# Patient Record
Sex: Male | Born: 1950 | Race: White
Health system: Southern US, Community
[De-identification: ages and names within clinical notes are randomized; demographics above are authoritative.]

---

## 2016-02-18 DIAGNOSIS — R972 Elevated prostate specific antigen [PSA]: Secondary | ICD-10-CM | POA: Insufficient documentation

## 2016-02-19 DIAGNOSIS — E291 Testicular hypofunction: Secondary | ICD-10-CM | POA: Insufficient documentation

## 2016-02-19 DIAGNOSIS — N529 Male erectile dysfunction, unspecified: Secondary | ICD-10-CM | POA: Insufficient documentation

## 2016-02-19 DIAGNOSIS — N400 Enlarged prostate without lower urinary tract symptoms: Secondary | ICD-10-CM | POA: Insufficient documentation

## 2017-07-24 ENCOUNTER — Ambulatory Visit (INDEPENDENT_AMBULATORY_CARE_PROVIDER_SITE_OTHER): Payer: BLUE CROSS/BLUE SHIELD

## 2017-07-24 ENCOUNTER — Ambulatory Visit (INDEPENDENT_AMBULATORY_CARE_PROVIDER_SITE_OTHER): Payer: BLUE CROSS/BLUE SHIELD | Admitting: Podiatry

## 2017-07-24 ENCOUNTER — Encounter: Payer: Self-pay | Admitting: Podiatry

## 2017-07-24 DIAGNOSIS — M21619 Bunion of unspecified foot: Secondary | ICD-10-CM | POA: Diagnosis not present

## 2017-07-24 DIAGNOSIS — M19071 Primary osteoarthritis, right ankle and foot: Secondary | ICD-10-CM

## 2017-07-24 DIAGNOSIS — M779 Enthesopathy, unspecified: Secondary | ICD-10-CM | POA: Diagnosis not present

## 2017-07-24 MED ORDER — MELOXICAM 15 MG PO TABS: 15.0000 mg | ORAL_TABLET | Freq: Every day | ORAL | 2 refills | Status: DC

## 2017-07-24 NOTE — Progress Notes (Signed)
   Subjective:    Patient ID: Wesley Campbell, male    DOB: Jun 09, 1951, 66 y.o.   MRN: [id number]  HPI  65 year old [place] the op city for concerns of right foot pain. He states that hurts across the top of his foot which is been ongoing for about 8 weeks and also going into the big toe. He states that he did have a rock hit his ankle before this happened his the ankle not pain subsided and the pain to the top of the foot started has continued since. He is also noticed swelling to the right foot which is new for him but denies any redness or warmth. He states he is a very flatfoot and is his been ongoing his entire life. He previously did have custom orthotics but not been wearing them recently. He denies any specific injury to his foot other than the rock incident that he can recall. He has no other concerns.   Review of Systems  All other systems reviewed and are negative.      Objective:   Physical Exam General: AAO x3, NAD  Dermatological: Skin is warm, dry and supple bilateral. Nails x 10 are well manicured; remaining integument appears unremarkable at this time. There are no open sores, no preulcerative lesions, no rash or signs of infection present.  Vascular: Dorsalis Pedis artery and Posterior Tibial artery pedal pulses are 2/4 bilateral with immedate capillary fill time. Pedal hair growth present. There is no pain with calf compression, swelling, warmth, erythema.   Neruologic: Grossly intact via light touch bilateral. Protective threshold with Semmes Wienstein monofilament intact to all pedal sites bilateral.  Musculoskeletal: There is moderate edema to the dorsal aspect of the right foot compared to his contralateral extremity with no erythema or increase in warmth associated with it. There is diffuse tenderness mostly along the Lisfranc joint more medial. Is also tenderness going into the hallux. There does be some mild discomfort vibratory sensation along the third metatarsal  base. There is no other area of pain to vibratory sensation. There is decreased range of motion of the subtalar joint. There is decrease in medial arch height. No pain with ankle joint range of motion there is no pain with ankle ligaments or bone. There is no other area of tenderness within 5 bilaterally. Muscular strength 5/5 in all groups tested bilateral.  Gait: Unassisted, Nonantalgic.      Assessment & Plan:  66 year old male right foot pain, swelling concern for possible stress fracture given swelling/pain. I think gout is unlikely given the longevity of symptoms.  -Treatment options discussed including all alternatives, risks, and complications -Etiology of symptoms were discussed -X-rays were obtained and reviewed with the patient. Arthritic changes are present. There is no evidence of acute fracture identified. -At this time given the swelling of actually bleeding we need to help rest this to get the swelling and the pain down. I believe that immobilization in a cam boot would be beneficial for him. This is dispensed him today. Also dispensed a compression anklet. Meloxicam prescribed for anti-inflammatory effect. Ice to the area as well. -Long-term he is going to need a new custom orthotic. I would have him come in to see Raiford Noble at his next appointment with me in which check with insurance authorization by [facility] scanned him today for the insert measurements due to the swelling to his foot. Hopefully we can do this next appointment. He agrees to this plan.   Ovid Curd, DPM

## 2017-08-03 ENCOUNTER — Telehealth: Payer: Self-pay | Admitting: Podiatry

## 2017-08-03 NOTE — Telephone Encounter (Signed)
Pt states he has a new pain and lots of swelling and is concerned about the new pain. Pt states he has been up on the foot moderately. I told pt Dr. Ardelle Anton specifically said to rest the area. I told pt I felt he would benefit from possibly an appt tomorrow or Monday. I told pt to rest as much as possible, ice the area 3-4 times daily for 15-20 minutes each session which would help with the pain and swelling, make sure to protect skin from the ice pack. Pt states understanding and I transferred to schedulers.

## 2017-08-03 NOTE — Telephone Encounter (Signed)
I'm calling because I'm continuing to have pain and it seems as though it has gotten worse in some ways. If you could please give this message to either Dr. Ardelle Anton or his nurse Misty Stanley as I would like to speak to either of them, preferably Dr. Ardelle Anton. My number is [phone].

## 2017-08-04 ENCOUNTER — Encounter: Payer: Self-pay | Admitting: Podiatry

## 2017-08-04 ENCOUNTER — Ambulatory Visit (INDEPENDENT_AMBULATORY_CARE_PROVIDER_SITE_OTHER): Payer: BLUE CROSS/BLUE SHIELD

## 2017-08-04 ENCOUNTER — Ambulatory Visit (INDEPENDENT_AMBULATORY_CARE_PROVIDER_SITE_OTHER): Payer: BLUE CROSS/BLUE SHIELD | Admitting: Podiatry

## 2017-08-04 DIAGNOSIS — M19071 Primary osteoarthritis, right ankle and foot: Secondary | ICD-10-CM | POA: Diagnosis not present

## 2017-08-04 DIAGNOSIS — M659 Synovitis and tenosynovitis, unspecified: Secondary | ICD-10-CM | POA: Diagnosis not present

## 2017-08-04 DIAGNOSIS — M779 Enthesopathy, unspecified: Secondary | ICD-10-CM

## 2017-08-04 NOTE — Progress Notes (Signed)
Subjective: Mr. Manseau presents the office today for follow-up of a way she continued pain and swelling to the right foot. He states the meloxicam is not helping he has not noticed significant improvement he is concerned of the foot is still swelling as well as the ankle. He is concerned that there may be a chip of bone on the ankle as he had a rock hit the ankle very hard before the swelling and the pain started. He does wear the cam bit which helped some. He has no other concerns today. Denies any systemic complaints such as fevers, chills, nausea, vomiting. No acute changes since last appointment, and no other complaints at this time.   Objective: AAO x3, NAD DP/PT pulses palpable bilaterally, CRT less than 3 seconds There is significant decrease in medial arch upon weightbearing. There is some mild discomfort along the midfoot along the right foot. The majority tenderness today. The medial aspect of the ankle and there is more tenderness in the course of posterior tibial tendons, flexor tendons just posterior inferior to the medial malleolus along the insertion of the navicular tuberosity. There is mild discomfort along the distal medial malleolus. There is decreased range of motion since surgery with ankle joint range of motion intact. It gets a recent mild improvement in the swelling compared to prior appointment there is no erythema or increase in warmth. No open lesions or pre-ulcerative lesions.  No pain with calf compression, swelling, warmth, erythema  Assessment: Posterior tibial tendon dysfunction, also arthritis right foot and ankle  Plan: -All treatment options discussed with the patient including all alternatives, risks, complications.  -X-rays were obtained and reviewed with the patient today. No definitive evidence of acute fracture identified how there is arthritic changes of the rear foot. -At this point recommended MRI of the right foot and ankle to evaluate dysfunction of the  posterior tibial tendon rule out tear of the tendon as well as evaluate also arthritis of the foot.-Continue with meloxicam also continue ice and immobilization in a cam boot. He declined a Medrol Dosepak. -Follow-up after MRI or sooner if any issues are to arise. -Patient encouraged to call the office with any questions, concerns, change in symptoms.   Ovid Curd, DPM

## 2017-08-07 ENCOUNTER — Telehealth: Payer: Self-pay | Admitting: *Deleted

## 2017-08-07 DIAGNOSIS — M19079 Primary osteoarthritis, unspecified ankle and foot: Secondary | ICD-10-CM

## 2017-08-07 DIAGNOSIS — M779 Enthesopathy, unspecified: Secondary | ICD-10-CM

## 2017-08-07 NOTE — Telephone Encounter (Addendum)
-----   Message from Vivi Barrack, DPM sent at 08/04/2017  2:50 PM EDT ----- Can you please order an MRI of the right foot and ankle to rule out posterior tibial tear and look at midfoot arthritis? He would like an open MRI. Thanks. 08/07/2017-Orders to [place] [place] for [employer], and faxed to [facility].

## 2017-08-14 ENCOUNTER — Telehealth: Payer: Self-pay | Admitting: *Deleted

## 2017-08-14 ENCOUNTER — Ambulatory Visit: Payer: BLUE CROSS/BLUE SHIELD | Admitting: Podiatry

## 2017-08-14 ENCOUNTER — Other Ambulatory Visit: Payer: BLUE CROSS/BLUE SHIELD | Admitting: Orthotics

## 2017-08-14 NOTE — Telephone Encounter (Signed)
[employer] requires PEER TO PEER for prior authorization for MRI right foot 73718, and right ankle 73721.

## 2017-08-17 NOTE — Telephone Encounter (Signed)
Authorization # [id number] until [facility]09/[facility].

## 2017-08-17 NOTE — Telephone Encounter (Signed)
Faxed orders with Authorization to [facility]. 

## 2017-08-24 ENCOUNTER — Ambulatory Visit
Admission: RE | Admit: 2017-08-24 | Discharge: 2017-08-24 | Disposition: A | Discharge status: Home / Self Care | Payer: BLUE CROSS/BLUE SHIELD | Source: Ambulatory Visit | Attending: Podiatry | Admitting: Podiatry

## 2017-08-25 ENCOUNTER — Telehealth: Payer: Self-pay | Admitting: *Deleted

## 2017-08-25 NOTE — Telephone Encounter (Addendum)
-----   Message from Vivi BarrackMatthew R Wagoner, DPM sent at 08/25/2017  2:10 PM EDT ----- + tear of posterior tibial tendon and possible stress fracture remain in CAM boot and schedule follow-up to discuss. Thanks. I informed pt of Dr. Gabriel RungWagoner's review of results and orders, transferred pt to schedulers for appt.

## 2017-09-04 ENCOUNTER — Ambulatory Visit (INDEPENDENT_AMBULATORY_CARE_PROVIDER_SITE_OTHER): Payer: BLUE CROSS/BLUE SHIELD | Admitting: Podiatry

## 2017-09-04 ENCOUNTER — Encounter: Payer: Self-pay | Admitting: Podiatry

## 2017-09-04 DIAGNOSIS — M76829 Posterior tibial tendinitis, unspecified leg: Secondary | ICD-10-CM

## 2017-09-04 DIAGNOSIS — M19071 Primary osteoarthritis, right ankle and foot: Secondary | ICD-10-CM

## 2017-09-04 DIAGNOSIS — S96911D Strain of unspecified muscle and tendon at ankle and foot level, right foot, subsequent encounter: Secondary | ICD-10-CM

## 2017-09-05 ENCOUNTER — Ambulatory Visit: Payer: BLUE CROSS/BLUE SHIELD | Admitting: Podiatry

## 2017-09-06 NOTE — Progress Notes (Signed)
Subjective: [name] presents the office today for follow-up evaluation of right foot and ankle pain. States that overall his pain is improving. He still gets some discomfort at times. He presents today wearing a regular shoe. He does wear the cam boot intermittently. He denies any recent injury or trauma since last appointment he denies any significant swelling the swelling overall has improved. Denies any redness or warmth. He has no other concerns today. Denies any systemic complaints such as fevers, chills, nausea, vomiting. No acute changes since last appointment, and no other complaints at this time.   Objective: AAO x3, NAD DP/PT pulses palpable bilaterally, CRT less than 3 seconds There is significant decrease in medial arch upon weightbearing. [place] tenderness today. Be localized just posterior to the medial malleolus along the posterior tibial tendon, flexor tendons. There is no significant discomfort on the first metatarsal base or cuneiform. There is no area pinpoint bony tenderness or pain the vibratory sensation. There is no slipping edema, erythema, increase in warmth. There is a significant decrease in medial arch upon weightbearing. Equinus is present. Subtalar joint range of motion decrease. Ankle joint range of motion intact. No open lesions or pre-ulcerative lesions.  No pain with calf compression, swelling, warmth, erythema  MRI 08/24/2017: IMPRESSION: 1. High-grade, partial tear of the posterior tibialis tendon with severe pes planus and evidence of lateral hindfoot impingement. 2. Extensive marrow edema in the medial cuneiform and first metatarsal with questionable nondisplaced fracture of the medial cuneiform. First metatarsal edema may be stress related. 3. Focal marrow edema in the medial aspect of the distal tibial metaphysis, possibly contusion or stress related Assessment: Posterior tibial tendon dysfunction/tear; also arthritis with flatfoot  Plan: -All  treatment options discussed with the patient including all alternatives, risks, complications.  -MRI results were discussed with the patient. See above. We had a long discussion both conservative as well as surgical treatment options. At this point he wishes to hold off any surgical intervention. We'll try for a custom molded orthotic. If this does not help discussed possible brace but he would keep the orthotic and he feels this will be helpful as he has had them before. He was molded for orthotics today and [facility]. -RTC 3 weeks to PUO or sooner if needed.  -Patient encouraged to call the office with any questions, concerns, change in symptoms.   Ovid CurdMatthew Wagoner, DPM

## 2017-09-27 ENCOUNTER — Encounter: Payer: BLUE CROSS/BLUE SHIELD | Admitting: Orthotics

## 2017-10-02 ENCOUNTER — Ambulatory Visit (INDEPENDENT_AMBULATORY_CARE_PROVIDER_SITE_OTHER): Payer: BLUE CROSS/BLUE SHIELD | Admitting: Podiatry

## 2017-10-02 DIAGNOSIS — M19071 Primary osteoarthritis, right ankle and foot: Secondary | ICD-10-CM

## 2017-10-02 DIAGNOSIS — M76829 Posterior tibial tendinitis, unspecified leg: Secondary | ICD-10-CM

## 2017-10-03 ENCOUNTER — Telehealth: Payer: Self-pay | Admitting: Podiatry

## 2017-10-03 NOTE — Telephone Encounter (Signed)
This message is for Dr. Gabriel RungWagoner's nurse. I wanted to give a piece of information that would help Dr. Ardelle AntonWagoner get a letter out to us. If you would please call me back at [phone]. Thank you.

## 2017-10-04 ENCOUNTER — Telehealth: Payer: Self-pay | Admitting: Podiatry

## 2017-10-04 NOTE — Telephone Encounter (Signed)
I'm calling to speak to Dr. Gabriel RungWagoner's nurse Misty StanleyLisa. If you would please give me a call back at [phone]. Thank you.

## 2017-10-05 ENCOUNTER — Telehealth: Payer: Self-pay | Admitting: *Deleted

## 2017-10-05 NOTE — Telephone Encounter (Signed)
Called and left a message for the patient to call me and that he could reach me at [phone]. Misty StanleyLisa

## 2017-10-05 NOTE — Progress Notes (Signed)
Patient was seen by Raiford Nobleick today to pick up orthotics.  Orthotics were dispensed and he states they are fitting well.  We discussed an oral and written break in instructions today.  He has no other concerns with his feet today. Follow-up in 4 weeks or sooner if needed.   He went to see me because he had questions about his daughter.  After discussion I will contact his daughter in regards to these issues as he wanted some clarification about her medical records and medical history.  I will contact Lurena JoinerRebecca myself.  Vivi BarrackMatthew R Dandria Griego DPM

## 2017-10-16 ENCOUNTER — Other Ambulatory Visit: Payer: Self-pay | Admitting: Podiatry

## 2017-10-17 NOTE — Telephone Encounter (Signed)
Called and left a message for the patient on 10/05/17. Wesley Campbell

## 2017-11-15 ENCOUNTER — Telehealth: Payer: Self-pay | Admitting: *Deleted

## 2017-11-15 NOTE — Telephone Encounter (Signed)
Refill request for Meloxicam. Dr. Ardelle AntonWagoner states pt needs an appt. Return fax denying.

## 2019-02-19 ENCOUNTER — Encounter: Payer: Self-pay | Admitting: Podiatry

## 2019-02-19 ENCOUNTER — Other Ambulatory Visit: Payer: Self-pay

## 2019-02-19 ENCOUNTER — Ambulatory Visit (INDEPENDENT_AMBULATORY_CARE_PROVIDER_SITE_OTHER): Payer: Medicare Other | Admitting: Podiatry

## 2019-02-19 VITALS — Temp 97.9°F

## 2019-02-19 DIAGNOSIS — L603 Nail dystrophy: Secondary | ICD-10-CM

## 2019-02-19 DIAGNOSIS — M79674 Pain in right toe(s): Secondary | ICD-10-CM | POA: Diagnosis not present

## 2019-02-20 NOTE — Progress Notes (Signed)
Subjective: 68 year old male presents the office today for concerns of his right fourth toenail becoming very elongated.  He states is not getting ingrown but he cannot trim it.  He was having nail trimmed.  Denies any redness or drainage or any swelling.  Ankles been doing great he has no other concerns. Denies any systemic complaints such as fevers, chills, nausea, vomiting. No acute changes since last appointment, and no other complaints at this time.   Objective: AAO x3, NAD DP/PT pulses palpable bilaterally, CRT less than 3 seconds The right fourth toenail significantly elongated and is curving distally into the plantar aspect of the toe.  Upon debridement there is no underlying ulceration drainage or any signs of infection.  There is no pain after debridement. No open lesions or pre-ulcerative lesions.  No pain with calf compression, swelling, warmth, erythema  Assessment: Onychodystrophy right  fourth toe  Plan: -All treatment options discussed with the patient including all alternatives, risks, complications.  -I debrided the right fourth toenail with any complications or bleeding.  Monitoring signs or symptoms of infection any other issues. -Patient encouraged to call the office with any questions, concerns, change in symptoms.   Vivi Barrack DPM

## 2019-10-12 IMAGING — MR MR ANKLE*R* W/O CM
4 of 5 series · 15 of 40 positions shown · non-contrast
Comparison: Right foot and ankle x-rays dated August 04, 2017.

CLINICAL DATA: Right foot and ankle pain for the past 4 months.

EXAM:
MRI OF THE RIGHT FOREFOOT WITHOUT CONTRAST; MRI OF THE RIGHT ANKLE
WITHOUT CONTRAST
TECHNIQUE: Multiplanar, multisequence MR imaging of the right foot and ankle
was performed. No intravenous contrast was administered.

[Series 4: PD fat-sat · axial · 4.0mm · 0.25mm/px · z∈[-5,+133]mm · 6 of 36 slices shown]
[im 1/36]
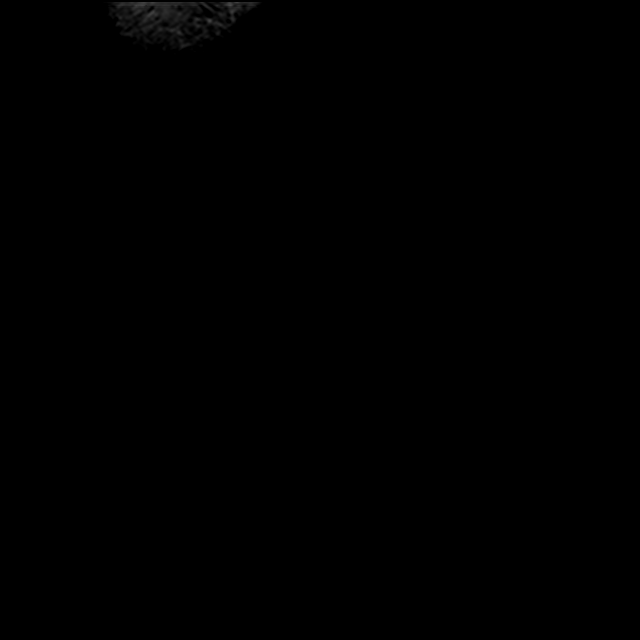
[im 5/36]
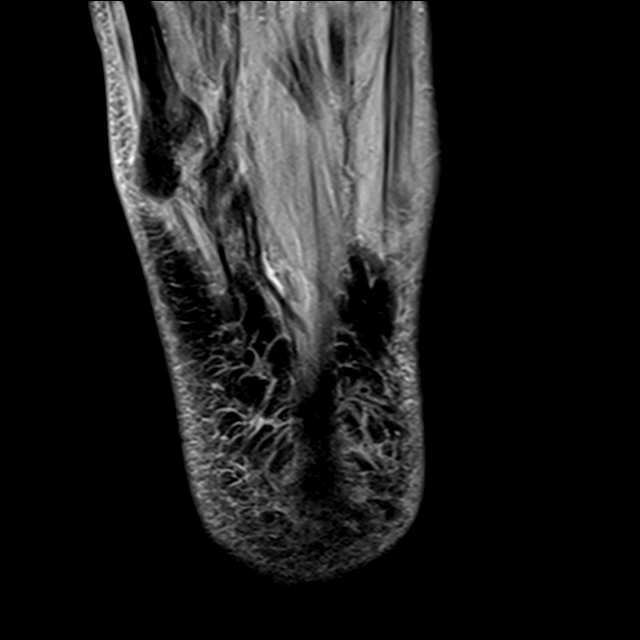
[im 9/36]
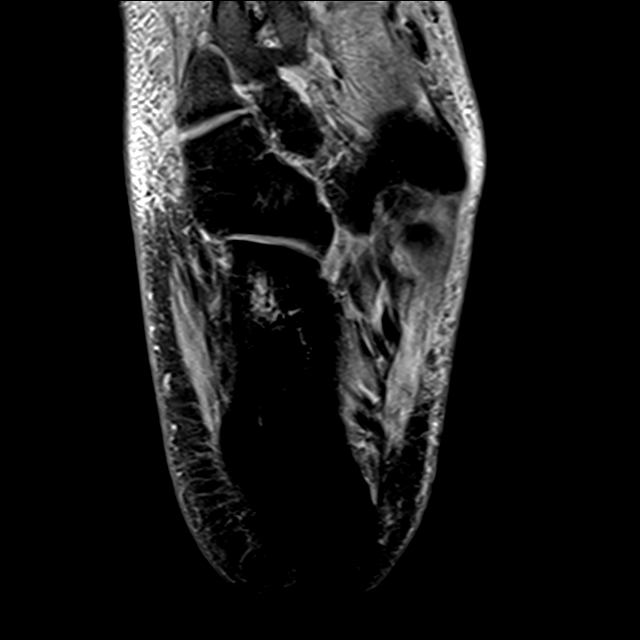
[im 14/36]
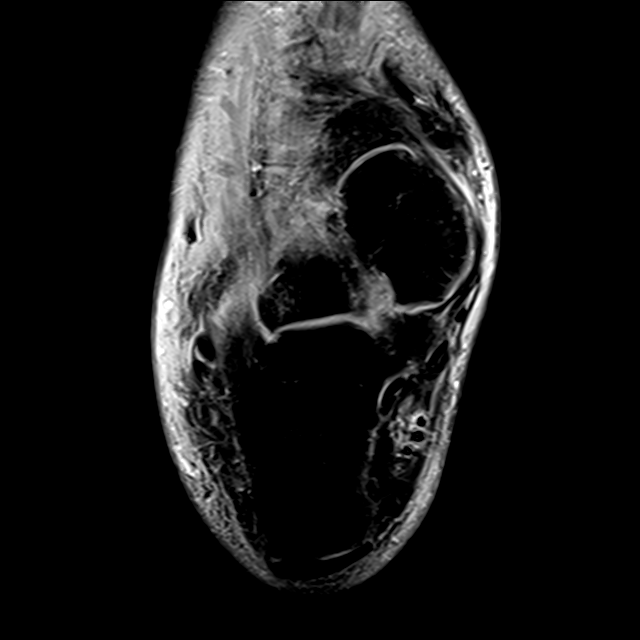
[im 18/36]
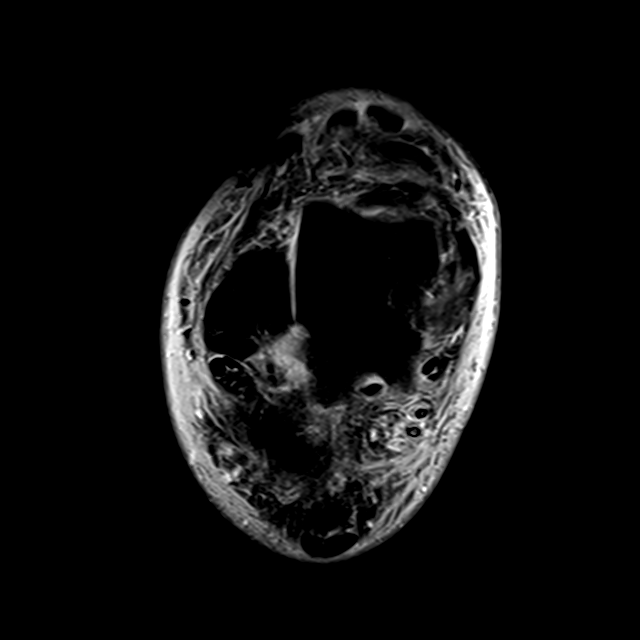
[im 31/36]
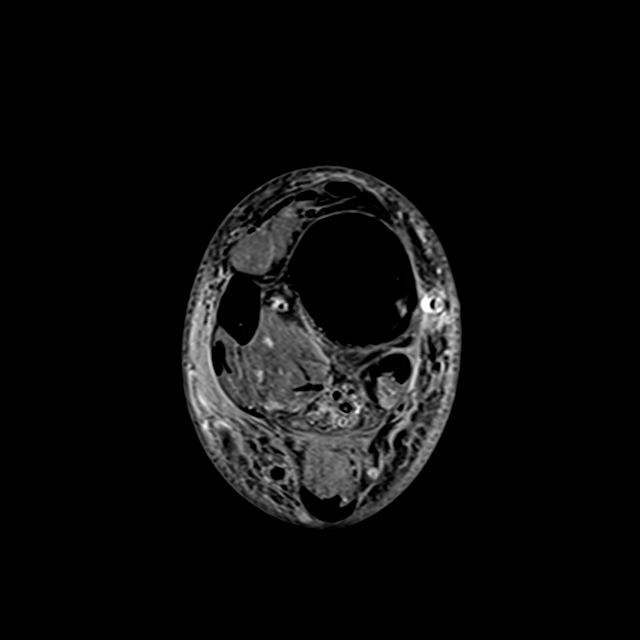

[Series 5: T2 fat-sat · axial · 4.0mm · 0.28mm/px · z∈[+13,+132]mm · 3 of 36 slices shown (1 of 3)]
[im 5/36]
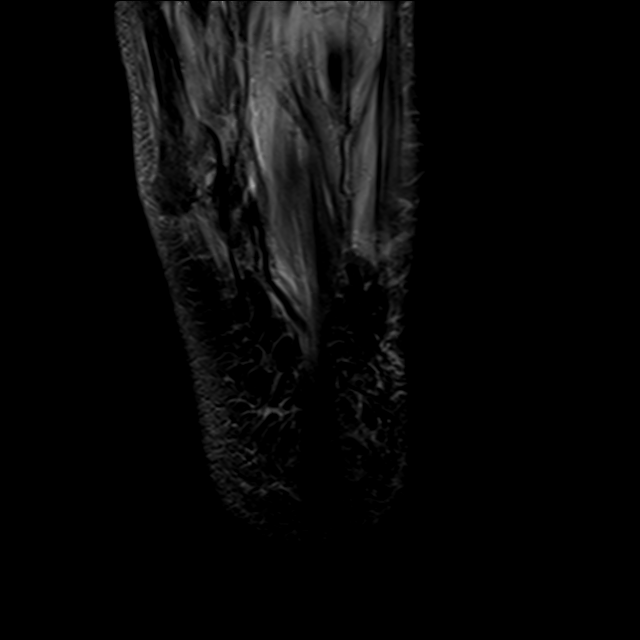
[im 18/36]
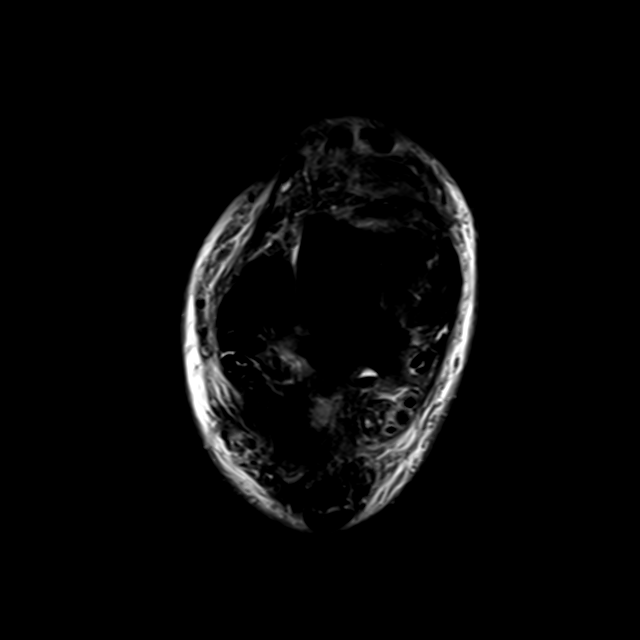
[im 31/36]
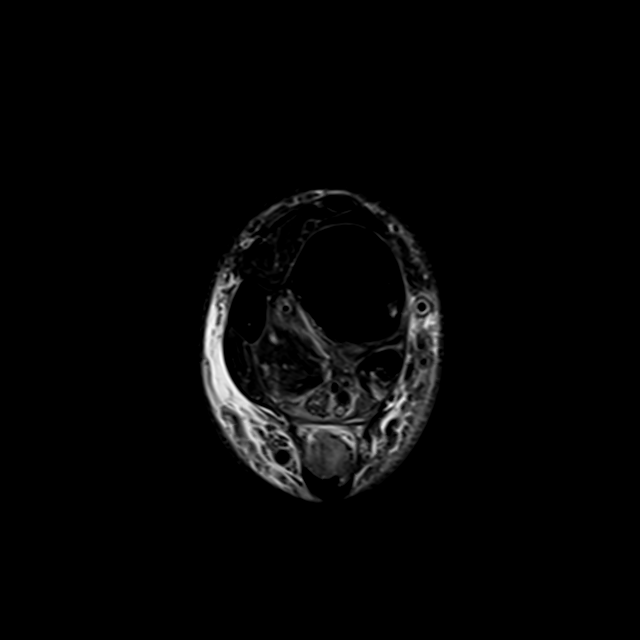

[Series 7: T2 fat-sat · coronal · 3.5mm · 0.35mm/px · 3 of 34 slices shown (2 of 3)]
[im 5/34]
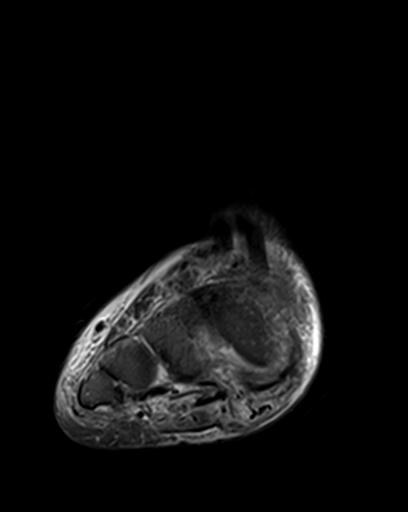
[im 19/34]
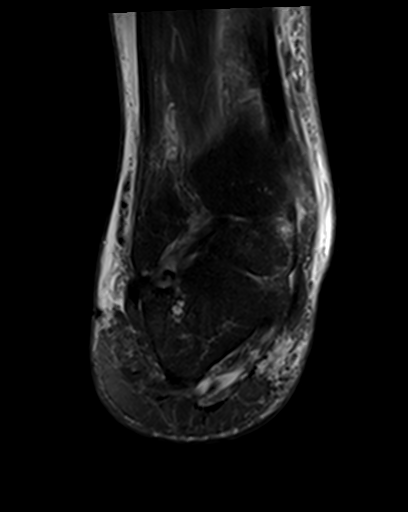
[im 29/34]
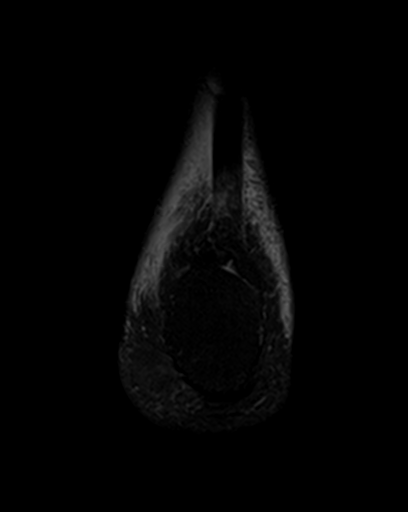

[Series 8: T2 fat-sat · sagittal · 3.0mm · 0.33mm/px · 3 of 28 slices shown (3 of 3)]
[im 5/28]
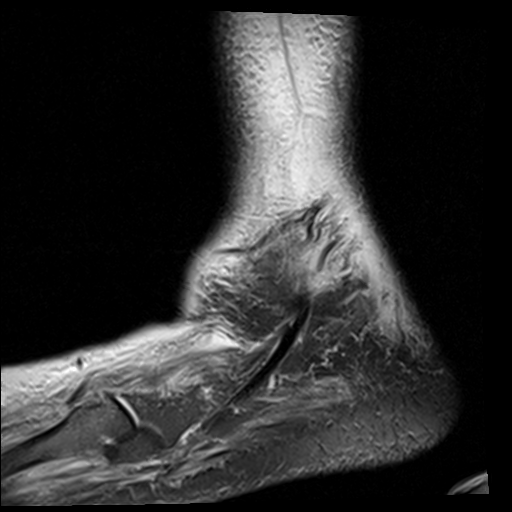
[im 14/28]
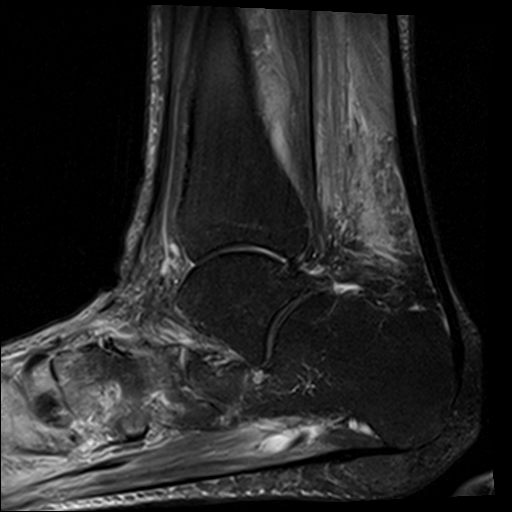
[im 23/28]
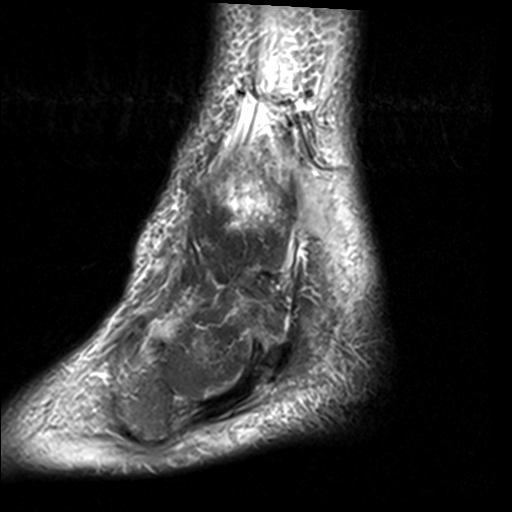

[15 of 40 positions shown; findings below may reference images not displayed]

FINDINGS: TENDONS

Peroneal: Intact and normally positioned.  Mild tenosynovitis.

Posteromedial: High-grade, partial tear of the posterior tibialis
tendon involving a length of approximately 4.5 cm. The flexor
digitorum longus and flexor hallucis longus tendons are intact. Mild
posterior tibialis and flexor digitorum longus tenosynovitis.

Anterior: Intact and normally positioned.

Achilles: Intact.

Plantar Fascia: Intact.

LIGAMENTS

Lateral: The anterior talofibular and calcaneofibular ligaments are
attenuated, likely due to prior injury. The posterior talofibular
ligament and anterior and posterior tibiofibular ligaments appear
intact.

Medial: The deltoid and visualized portions of the spring ligament
appear intact.

CARTILAGE AND BONES

Ankle Joint: No significant ankle joint effusion. The talar dome and
tibial plafond are intact.

Subtalar Joints/Sinus Tarsi: Mild degenerative changes of the
posterior subtalar joint. Normal signal in the sinus tarsi.

Bones: Severe pes planus with prominent edema in the lateral talar
process and adjacent calcaneus. Extensive marrow edema in the medial
cuneiform and proximal and mid first metatarsal. Questionable
fracture line in the medial cuneiform. Focal marrow edema in the
medial aspect of the distal tibial metaphysis. No marrow replacing
lesion.

Other: Diffuse soft tissue swelling about the ankle and dorsal
forefoot. Diffuse atrophy and edema of the intrinsic muscles of the
forefoot.

The Lisfranc ligament appears intact.
IMPRESSION: 1. High-grade, partial tear of the posterior tibialis tendon with
severe pes planus and evidence of lateral hindfoot impingement.
2. Extensive marrow edema in the medial cuneiform and first
metatarsal with questionable nondisplaced fracture of the medial
cuneiform. First metatarsal edema may be stress related.
3. Focal marrow edema in the medial aspect of the distal tibial
metaphysis, possibly contusion or stress related.

## 2020-11-06 ENCOUNTER — Telehealth: Payer: Self-pay | Admitting: Podiatry

## 2020-11-06 NOTE — Telephone Encounter (Signed)
Patient would like a referral/reccomendation for a orthopedic doctor for knee/hip.

## 2020-11-09 NOTE — Telephone Encounter (Signed)
Please let him know that I would recommend [facility] or Dewaine Conger. Thanks!

## 2020-11-19 ENCOUNTER — Other Ambulatory Visit: Payer: Self-pay

## 2020-11-19 ENCOUNTER — Ambulatory Visit (INDEPENDENT_AMBULATORY_CARE_PROVIDER_SITE_OTHER): Payer: Medicare Other | Admitting: Orthotics

## 2020-11-19 DIAGNOSIS — L603 Nail dystrophy: Secondary | ICD-10-CM

## 2020-11-19 DIAGNOSIS — M79674 Pain in right toe(s): Secondary | ICD-10-CM

## 2020-11-19 NOTE — Progress Notes (Signed)
Cast today for new f/o to replace old ones:  Flex f/o with good arch support r/f neutral post, full length.  Patient signd Medicare ABN

## 2020-12-17 ENCOUNTER — Encounter: Payer: Medicare Other | Admitting: Orthotics

## 2020-12-25 ENCOUNTER — Ambulatory Visit: Payer: Medicare Other

## 2020-12-25 ENCOUNTER — Other Ambulatory Visit: Payer: Self-pay

## 2020-12-25 DIAGNOSIS — L603 Nail dystrophy: Secondary | ICD-10-CM

## 2020-12-28 ENCOUNTER — Telehealth: Payer: Self-pay | Admitting: Podiatry

## 2020-12-28 NOTE — Telephone Encounter (Signed)
Pt left old orthotics to be refurbished on Friday when he picked up the new ones.   I called pt to confirm he wanted them to be refurbished and he was aware of the cost. Of 90.00. pt was aware. I told pt I would call when they come back in.

## 2020-12-29 NOTE — Progress Notes (Signed)
Pt came in the office today to pick up orthotics. Fitting instruction were given and we also told the patient to give [place] a call if they have any questions.

## 2023-10-30 ENCOUNTER — Ambulatory Visit (INDEPENDENT_AMBULATORY_CARE_PROVIDER_SITE_OTHER): Payer: Medicare Other | Admitting: Podiatry

## 2023-10-30 DIAGNOSIS — B351 Tinea unguium: Secondary | ICD-10-CM | POA: Diagnosis not present

## 2023-10-30 DIAGNOSIS — M79675 Pain in left toe(s): Secondary | ICD-10-CM | POA: Diagnosis not present

## 2023-10-30 DIAGNOSIS — M79674 Pain in right toe(s): Secondary | ICD-10-CM

## 2023-10-30 NOTE — Progress Notes (Signed)
Subjective: Chief Complaint  Patient presents with   Nail Problem    Bilateral excessive nail growth. They are curling downward and it is difficult for him to trim them. Not diabetic and not on anticoag.    72 year old male presents after the above concerns.He states the toenails are curving down and are causing discomfort.  They become thickened and difficult to trim.  Denies any swelling redness or any drainage of the causing discomfort with the elongated and rubbing.  Objective: AAO x3, NAD DP/PT pulses palpable bilaterally, CRT less than 3 seconds Hammertoe deformity noted the nails are hypertrophic, dystrophic and elongated and are curving down.  There is no edema, erythema or signs of infection. No pain with calf compression, swelling, warmth, erythema  Assessment: Symptomatic onychomycosis, onychodystrophy  Plan: -All treatment options discussed with the patient including all alternatives, risks, complications.  -Discussed options.  Sharply debrided the nails without any complications or bleeding x 10.  Recommend routine debridement. Can use urea nail gel to help with the dystrophy.  -Patient encouraged to call the office with any questions, concerns, change in symptoms.   Vivi Barrack DPM

## 2024-03-19 ENCOUNTER — Ambulatory Visit (INDEPENDENT_AMBULATORY_CARE_PROVIDER_SITE_OTHER): Admitting: Podiatry

## 2024-03-19 DIAGNOSIS — M79675 Pain in left toe(s): Secondary | ICD-10-CM | POA: Diagnosis not present

## 2024-03-19 DIAGNOSIS — B351 Tinea unguium: Secondary | ICD-10-CM | POA: Diagnosis not present

## 2024-03-19 DIAGNOSIS — M79674 Pain in right toe(s): Secondary | ICD-10-CM | POA: Diagnosis not present

## 2024-03-20 NOTE — Progress Notes (Signed)
 Subjective: Chief Complaint  Patient presents with   RFC    RM#11 RFC no other concerns at this time just nail trim.    73 year old male presents after the above concerns.  States he has been doing well.  He has difficulty trimming some of the nails and they get along causing discomfort.  No swelling or redness or any drainage.    Objective: AAO x3, NAD DP/PT pulses palpable bilaterally, CRT less than 3 seconds Nails are hypertrophic, dystrophic, brittle, discolored, elongated 10. No surrounding redness or drainage. Tenderness nails 1-5 bilaterally. No open lesions or pre-ulcerative lesions are identified today. No pain with calf compression, swelling, warmth, erythema  Assessment: Symptomatic onychomycosis   Plan: -All treatment options discussed with the patient including all alternatives, risks, complications.  -Discussed options.  Sharply debrided the nails without any complications or bleeding x 10.   -Patient encouraged to call the office with any questions, concerns, change in symptoms.   Charity Conch DPM
